# Patient Record
Sex: Female | Born: 1935
Health system: Southern US, Community
[De-identification: ages and names within clinical notes are randomized; demographics above are authoritative.]

## PROBLEM LIST (undated history)

## (undated) DIAGNOSIS — F0390 Unspecified dementia without behavioral disturbance: Secondary | ICD-10-CM

## (undated) DIAGNOSIS — E7800 Pure hypercholesterolemia, unspecified: Secondary | ICD-10-CM

## (undated) HISTORY — PX: OTHER SURGICAL HISTORY: SHX169

---

## 1998-03-19 ENCOUNTER — Ambulatory Visit (HOSPITAL_COMMUNITY): Admission: RE | Admit: 1998-03-19 | Discharge: 1998-03-19 | Payer: Self-pay | Admitting: *Deleted

## 1998-03-19 ENCOUNTER — Encounter: Payer: Self-pay | Admitting: Family Medicine

## 1998-04-16 ENCOUNTER — Encounter: Payer: Self-pay | Admitting: General Surgery

## 1998-04-17 ENCOUNTER — Observation Stay (HOSPITAL_COMMUNITY): Admission: RE | Admit: 1998-04-17 | Discharge: 1998-04-18 | Payer: Self-pay | Admitting: General Surgery

## 1999-01-29 ENCOUNTER — Other Ambulatory Visit: Admission: RE | Admit: 1999-01-29 | Discharge: 1999-01-29 | Payer: Self-pay | Admitting: Obstetrics and Gynecology

## 1999-12-16 ENCOUNTER — Encounter: Payer: Self-pay | Admitting: Obstetrics and Gynecology

## 1999-12-16 ENCOUNTER — Encounter: Admission: RE | Admit: 1999-12-16 | Discharge: 1999-12-16 | Payer: Self-pay | Admitting: Obstetrics and Gynecology

## 2000-02-01 ENCOUNTER — Other Ambulatory Visit: Admission: RE | Admit: 2000-02-01 | Discharge: 2000-02-01 | Payer: Self-pay | Admitting: Obstetrics and Gynecology

## 2000-12-19 ENCOUNTER — Encounter: Payer: Self-pay | Admitting: Obstetrics and Gynecology

## 2000-12-19 ENCOUNTER — Encounter: Admission: RE | Admit: 2000-12-19 | Discharge: 2000-12-19 | Payer: Self-pay | Admitting: Obstetrics and Gynecology

## 2001-02-02 ENCOUNTER — Other Ambulatory Visit: Admission: RE | Admit: 2001-02-02 | Discharge: 2001-02-02 | Payer: Self-pay | Admitting: Internal Medicine

## 2001-12-12 ENCOUNTER — Encounter: Payer: Self-pay | Admitting: Obstetrics and Gynecology

## 2001-12-12 ENCOUNTER — Encounter: Admission: RE | Admit: 2001-12-12 | Discharge: 2001-12-12 | Payer: Self-pay | Admitting: Obstetrics and Gynecology

## 2002-10-22 ENCOUNTER — Encounter: Payer: Self-pay | Admitting: Obstetrics and Gynecology

## 2002-10-22 ENCOUNTER — Ambulatory Visit (HOSPITAL_COMMUNITY): Admission: RE | Admit: 2002-10-22 | Discharge: 2002-10-22 | Payer: Self-pay | Admitting: Obstetrics and Gynecology

## 2002-12-17 ENCOUNTER — Encounter: Payer: Self-pay | Admitting: Obstetrics and Gynecology

## 2002-12-17 ENCOUNTER — Ambulatory Visit (HOSPITAL_COMMUNITY): Admission: RE | Admit: 2002-12-17 | Discharge: 2002-12-17 | Payer: Self-pay | Admitting: Obstetrics and Gynecology

## 2003-02-04 ENCOUNTER — Ambulatory Visit (HOSPITAL_COMMUNITY): Admission: RE | Admit: 2003-02-04 | Discharge: 2003-02-04 | Payer: Self-pay | Admitting: Internal Medicine

## 2003-12-18 ENCOUNTER — Ambulatory Visit (HOSPITAL_COMMUNITY): Admission: RE | Admit: 2003-12-18 | Discharge: 2003-12-18 | Payer: Self-pay | Admitting: Obstetrics and Gynecology

## 2004-07-02 ENCOUNTER — Ambulatory Visit: Payer: Self-pay | Admitting: Internal Medicine

## 2004-07-20 ENCOUNTER — Ambulatory Visit: Payer: Self-pay | Admitting: Internal Medicine

## 2004-12-01 ENCOUNTER — Ambulatory Visit: Payer: Self-pay | Admitting: Internal Medicine

## 2004-12-17 ENCOUNTER — Ambulatory Visit (HOSPITAL_COMMUNITY): Admission: RE | Admit: 2004-12-17 | Discharge: 2004-12-17 | Payer: Self-pay | Admitting: Obstetrics and Gynecology

## 2005-01-19 ENCOUNTER — Ambulatory Visit: Payer: Self-pay | Admitting: Internal Medicine

## 2005-01-25 ENCOUNTER — Ambulatory Visit: Payer: Self-pay | Admitting: Internal Medicine

## 2005-01-25 ENCOUNTER — Ambulatory Visit (HOSPITAL_COMMUNITY): Admission: RE | Admit: 2005-01-25 | Discharge: 2005-01-25 | Payer: Self-pay | Admitting: Internal Medicine

## 2005-05-06 ENCOUNTER — Ambulatory Visit: Payer: Self-pay | Admitting: Internal Medicine

## 2005-08-04 ENCOUNTER — Ambulatory Visit: Payer: Self-pay | Admitting: Internal Medicine

## 2005-09-08 ENCOUNTER — Ambulatory Visit: Payer: Self-pay | Admitting: Internal Medicine

## 2005-12-20 ENCOUNTER — Ambulatory Visit (HOSPITAL_COMMUNITY): Admission: RE | Admit: 2005-12-20 | Discharge: 2005-12-20 | Payer: Self-pay | Admitting: Family Medicine

## 2006-01-21 ENCOUNTER — Ambulatory Visit: Payer: Self-pay | Admitting: Internal Medicine

## 2006-05-27 ENCOUNTER — Ambulatory Visit: Payer: Self-pay | Admitting: Internal Medicine

## 2006-07-08 ENCOUNTER — Ambulatory Visit: Payer: Self-pay | Admitting: Internal Medicine

## 2006-09-09 ENCOUNTER — Ambulatory Visit: Payer: Self-pay | Admitting: Internal Medicine

## 2006-12-23 ENCOUNTER — Ambulatory Visit (HOSPITAL_COMMUNITY): Admission: RE | Admit: 2006-12-23 | Discharge: 2006-12-23 | Payer: Self-pay | Admitting: Obstetrics and Gynecology

## 2007-01-25 ENCOUNTER — Ambulatory Visit: Payer: Self-pay | Admitting: Internal Medicine

## 2007-05-23 ENCOUNTER — Emergency Department (HOSPITAL_COMMUNITY): Admission: EM | Admit: 2007-05-23 | Discharge: 2007-05-23 | Payer: Self-pay | Admitting: *Deleted

## 2007-05-29 ENCOUNTER — Ambulatory Visit: Payer: Self-pay | Admitting: Internal Medicine

## 2007-07-07 ENCOUNTER — Encounter: Payer: Self-pay | Admitting: Internal Medicine

## 2007-07-28 DIAGNOSIS — J45909 Unspecified asthma, uncomplicated: Secondary | ICD-10-CM | POA: Insufficient documentation

## 2007-07-28 DIAGNOSIS — K219 Gastro-esophageal reflux disease without esophagitis: Secondary | ICD-10-CM | POA: Insufficient documentation

## 2007-07-28 DIAGNOSIS — J309 Allergic rhinitis, unspecified: Secondary | ICD-10-CM | POA: Insufficient documentation

## 2007-07-31 ENCOUNTER — Ambulatory Visit: Payer: Self-pay | Admitting: Internal Medicine

## 2007-07-31 DIAGNOSIS — J45909 Unspecified asthma, uncomplicated: Secondary | ICD-10-CM | POA: Insufficient documentation

## 2007-10-09 ENCOUNTER — Ambulatory Visit: Payer: Self-pay | Admitting: Internal Medicine

## 2007-12-26 ENCOUNTER — Ambulatory Visit (HOSPITAL_COMMUNITY): Admission: RE | Admit: 2007-12-26 | Discharge: 2007-12-26 | Payer: Self-pay | Admitting: Family Medicine

## 2008-01-08 ENCOUNTER — Ambulatory Visit: Payer: Self-pay | Admitting: Internal Medicine

## 2008-06-11 ENCOUNTER — Ambulatory Visit: Payer: Self-pay | Admitting: Internal Medicine

## 2008-12-24 ENCOUNTER — Ambulatory Visit: Payer: Self-pay | Admitting: Internal Medicine

## 2008-12-26 ENCOUNTER — Ambulatory Visit (HOSPITAL_COMMUNITY): Admission: RE | Admit: 2008-12-26 | Discharge: 2008-12-26 | Payer: Self-pay | Admitting: Obstetrics and Gynecology

## 2010-04-29 ENCOUNTER — Ambulatory Visit (HOSPITAL_COMMUNITY)
Admission: RE | Admit: 2010-04-29 | Discharge: 2010-04-29 | Payer: Self-pay | Source: Home / Self Care | Admitting: Obstetrics and Gynecology

## 2010-06-28 ENCOUNTER — Encounter: Payer: Self-pay | Admitting: Obstetrics and Gynecology

## 2010-10-23 NOTE — Consult Note (Signed)
NAME:  Angela [name], Angela [name]                          ACCOUNT NO.:  [id number]   MEDICAL RECORD NO.:  [id number]                  PATIENT TYPE:   LOCATION:                                       FACILITY:   PHYSICIAN:  R. Roetta Sessions, M.D.              DATE OF BIRTH:  1932/05/28   DATE OF CONSULTATION:  01/28/2003  DATE OF DISCHARGE:                                   CONSULTATION   REFERRING PHYSICIAN:  Gaynelle Cage, M.D.   CONSULTING PHYSICIAN:  R. Roetta Sessions, M.D.   REASON FOR REFERRAL:  Diarrhea.   HISTORY OF PRESENT ILLNESS:  Angela [name] is a pleasant 77 year old  female kindly sent over at the courtesy of Dr. Gaynelle Cage to further evaluate  an approximate 58-month history of what she describes as diarrhea.  She  describes three to four loose nonbloody bowel movements each morning after  she gets up.  Sometimes before breakfast, sometimes after.  Really has not  had much in the way of any associated abdominal pain.  She has not had any  blood per rectum.  She states after she has her morning bowel movement she  does not have a stool later in the day or at night.  She is not awakened  from sleep to have a bowel movement, and again, she has not had any blood  per rectum.  Accompanying records indicate that she had an O&P assay done on  her stool as well as a C. difficile that came back negative.  She did take  antibiotics last month for a bladder infection.  She has not had any foreign  travel, no one else around her has been sick with diarrhea.  She has never  had a complete colonoscopy, although she describes having what sounds like a  sigmoidoscopy by Dr. Kendrick Ranch down in [place] some 15 years ago.  No  family history of colorectal neoplasia.   In addition to the above mentioned symptoms, she had a several year history  of nearly daily heartburn for which she has taken a variety of over-the-  counter agents.  She has never had her upper GI tract evaluated.  More  recently, she takes Prilosec OTC on a p.r.n. basis.  She does not have  odynophagia, no dysphagia.  She does readily admit to drinking two to three  glasses of wine or glasses of hard liquor on a daily basis to calm her  nerves.  She usually does this in the afternoon.  She tells me that she  needs this to calm her nerves and she does not feel that she can stop this  behavior.  There is no tobacco history.   PAST MEDICAL HISTORY:  1. Recurrent cystitis.  Had an evaluation by Dr. Annabell Howells down in [place]     recently, apparently had a cystoscopy and a pelvic ultrasound.  2. History of gastroesophageal reflux disease.  3.  Hypothyroidism.  4. Asthma.   PAST SURGICAL HISTORY:  Cholecystectomy.   CURRENT MEDICATIONS:  1. Advair 100/50 once daily.  2. Synthroid 100 mcg daily.  3. Fluoxetine 20 mg daily.  4. Trazodone 50 mg 1/2 tablet at bedtime.  5. Caltrate 600 mg daily.  6. Imodium p.r.n.  7. Alavert p.r.n.  8. Prilosec OTC.   ALLERGIES:  Apparently, she has no known drug allergies.   FAMILY HISTORY:  Mother died at age 19 with lung problems.  Father died at  age 6 with CVA.  There is no history of chronic GI or liver illness.   SOCIAL HISTORY:  The patient has been married for 37 years.  She has four  children.  She is retired from [employer]  Does not smoke.  Alcohol  consumption as outlined above.   REVIEW OF SYSTEMS:  No chest pain, no dyspnea, fever, chills, change in  weight.   PHYSICAL EXAMINATION:  GENERAL:  A pleasant 77 year old lady resting  comfortably.  VITAL SIGNS:  Weight 142, height 5 feet 1 inch, temperature 98.5, blood  pressure 118/78, pulse 66.  SKIN:  Warm and dry.  There is no jaundice.  No continued stigmata of  chronic liver disease.  HEENT:  No scleral icterus.  Conjunctivae are pink.  Dentition in fair state  of repair.  Oral cavity without lesions.  NECK:  JVD not prominent.  CHEST:  Lungs are clear to auscultation.  HEART:  Regular rate and  rhythm without murmurs, rubs, or gallops.  BREASTS:  Deferred.  ABDOMEN:  Benign.  Positive bowel sounds, soft, nontender, without  appreciable mass or organomegaly.  RECTAL:  Deferred until time of colonoscopy.   ADMITTING IMPRESSION:  Angela [name] is a pleasant 77 year old lady  with a three month history of loose stools predominately in the morning.  Bowel movements are fairly close together in time, and she really never has  any problems with diarrhea or the need to have a bowel movement later in the  day or evening.   See if toxicology assay and O&P assay came back negative.  She has been on  antibiotics for the past several weeks.   Differential diagnoses includes an occult infectious process (which I tend  to doubt at this point), microscopic colitis, or more likely an element of  irritable bowel syndrome with possibly a component of regular alcohol  consumption as a factor in her perceived diarrhea.   She needs to have her entire lower GI tract evaluated via colonoscopy for  screening purposes anyway.  She has previously put this off, however, she  now is in agreement to proceed.  This lady also has long-standing frequent  (daily) gastroesophageal reflux symptoms, although she does not have any  alarm symptoms, she ought to have her upper GI tract evaluated as a separate  issue.  To this end, I have offered Angela [name] both an EGD and colonoscopy  in the near future at [facility].  The risks, benefits, and  alternatives have been reviewed.  Questions  answered, she is agreeable.  We will make further recommendations once  endoscopic evaluation has been completed.   I would like to thank Dr. Gaynelle Cage once again for allowing me see this nice  lady today.  Jonathon Bellows, M.D.    RMR/MEDQ  D:  01/28/2003  T:  01/28/2003  Job:  [id number]  cc:   Gaynelle Cage, MD  [phone]  [place]  [place] [address]  Fax:  [phone]

## 2010-10-23 NOTE — Assessment & Plan Note (Signed)
[facility]                             PULMONARY OFFICE NOTE   Angela, [name]                     MRN:          [id number]  DATE:07/08/2006                            DOB:          July 09, 1931    PULMONARY/ALLERGY FOLLOWUP:   PROBLEMS:  1. Allergic asthma.  2. Allergic rhinitis.  3. Esophageal reflux.   HISTORY:  A one year followup.  She says she is doing very well, using  Advair just once a day.  Occasional rhinorrhea.  No real asthma  breakthroughs since she began Advair.  She continues allergy vaccine  with no problems, giving her own injections.  I went through our review  of issues related to administration outside of a medical office,  anaphylaxis, and epinephrine.  We updated her EpiPen with discussion.   MEDICATIONS:  1. Advair 100/50.  2. Allergy vaccine.  3. Synthroid 75 mcg.  4. Prozac 40 mg.  5. Simvastatin 40 mg.  6. PRN use of albuterol and epinephrine.   No medication allergy.   OBJECTIVE:  VITAL SIGNS:  Weight 138 pounds.  BP 110/70.  Pulse regular  at 68.  Room air saturation 97%.  GENERAL:  She looks comfortable.  HEENT:  Clear.  CHEST:  Quiet without wheezes, rales or rhonchi.  Work of breathing is  not increased.  CARDIAC:  Heart sounds are regular without murmur heard.  There is no  edema.   IMPRESSION:  Good control of allergic rhinitis and mild asthma.   PLAN:  1. We discussed and gave a trial of Astelin for p.r.n. use.  She will      pay attention this spring to whether it is helpful, 1 spray each      nostril b.i.d. p.r.n.  2. We refilled Advair 100/50 and her rescue albuterol inhaler.  3. EpiPen.  4. Schedule return in one year, earlier p.r.n.     Clinton D. Maple Hudson, MD, Tonny Bollman, FACP  Electronically Signed    CDY/MedQ  DD: 07/09/2006  DT: 07/09/2006  Job #: [id number]   cc:   Ernestina Penna, M.D.

## 2010-10-23 NOTE — Op Note (Signed)
NAMEDONETTA, ISAZA              ACCOUNT NO.:  [id number]   MEDICAL RECORD NO.:  [id number]          PATIENT TYPE:  AMB   LOCATION:  DAY                           FACILITY:  [facility]   PHYSICIAN:  R. Roetta Sessions, M.D. DATE OF BIRTH:  [birth year]   DATE OF PROCEDURE:  01/25/2005  DATE OF DISCHARGE:                                 OPERATIVE REPORT   PROCEDURE:  Diagnostic esophagogastroduodenoscopy.   INDICATION FOR PROCEDURE:  The patient is a 77 year old lady with a recent  worsening of reflux symptoms, taking Actonel and Fosamax.  Prilosec was  changed to Prevacid.  She stopped taking the above-mentioned agents and her  reflux symptoms have pretty much resolved.  She has not had any odynophagia  or dysphagia.  EGD is now being done.   PROCEDURE NOTE:  O2 saturation, blood pressure, pulse and respirations were  monitored throughout the entire procedure.  This procedure was discussed  with the patient, along with the potential risks, benefits, and alternatives  prior to the procedure.   CONSCIOUS SEDATION:  IV Versed and Demerol in incremental doses.   INSTRUMENT USED:  Olympus video chip system.   FINDINGS:  Examination of the tubular esophagus revealed no mucosal  abnormalities.  The EG junction was patulous, easily traversed.   Stomach:  The gastric cavity was empty and insufflated well with air.  A  thorough examination of the gastric mucosa including retroflexed view of the  proximal stomach and esophagogastric junction demonstrated only a hiatal  hernia.  Pylorus patent and easily traversed.  Examination of the bulb and  second portion revealed no abnormalities.   Therapeutic/diagnostic maneuvers performed:  None.   The patient tolerated the procedure well, was reacted in endoscopy.   IMPRESSION:  Normal esophageal mucosa, patulous esophagogastric junction,  hiatal hernia, otherwise normal stomach, normal D1, D2.   RECOMMENDATIONS:  1.  Continue Prevacid 30 mg orally  daily.  2.  Antireflux literature provided to Ms. Trombetta.  3.  Follow up with Ernestina Penna, M.D., as planned.      Jonathon Bellows, M.D.  Electronically Signed     RMR/MEDQ  D:  01/25/2005  T:  01/25/2005  Job:  [id number]   cc:   Ernestina Penna, M.D.  [address] [place]  [place] [address]  Fax: [phone]

## 2010-10-23 NOTE — Op Note (Signed)
NAME:  Angela [name], Angela [name]                        ACCOUNT NO.:  [id number]   MEDICAL RECORD NO.:  [id number]                   PATIENT TYPE:  AMB   LOCATION:  DAY                                  FACILITY:  [facility]   PHYSICIAN:  R. Roetta Sessions, M.D.              DATE OF BIRTH:  1932-04-27   DATE OF PROCEDURE:  DATE OF DISCHARGE:                                 OPERATIVE REPORT   PROCEDURE:  Esophagogastroduodenoscopy and colonoscopy with snare  polypectomy with biopsy.   ENDOSCOPIST:  Gerrit Friends. Rourk, M.D.   INDICATIONS FOR PROCEDURE:  The patient is a 77 year old lady with  longstanding gastroesophageal reflux symptoms and several month history of  diarrhea.  She has never had her lower GI tract imaged.  EGD and colonoscopy  are now being done for the above-mentioned reasons.  This approach has been  discussed with the patient at length, previously. The potential risks,  benefits, and alternatives have been reviewed; questions answered.  She is  agreeable.  Please see my dictated H&P for more information.   PROCEDURE NOTE:  O2 saturation, blood pressure, pulse and respirations were  monitored throughout the entire procedure.  Conscious sedation: Versed 4 mg  IV, Demerol 100 mg IV in divided doses.   INSTRUMENT:  Olympus video chip adult gastroscope and colonoscope.   FINDINGS:  Examination of the tubular esophagus revealed a patulous EG  junction with distal esophageal erosions.  There is no Barrett's esophagus  or other abnormality.  The EG junction was easily traversed.   STOMACH:  The gastric cavity was empty.  It insufflated well with air.  A  thorough examination of the gastric mucosa including a retroflex view of the  proximal stomach and esophagogastric junction demonstrated only a moderate  small-sized hiatal hernia.  The pylorus was patent and easily traversed.   DUODENUM:  The bulb and the second portion appeared normal.   THERAPEUTIC/DIAGNOSTIC MANEUVERS:  None.   The patient tolerated the procedure well and was prepared for colonoscopy.  A digital rectal exam revealed no abnormalities.   ENDOSCOPIC FINDINGS:  The prep was adequate.   RECTUM:  Examination of the rectal mucosa including the retroflex view of  the anal verge revealed a 5-mm polyp on a stalk in the distal rectum.  The  remainder of the rectal mucosa appeared normal.   COLON:  The colonic mucosa was surveyed from the rectosigmoid junction  through the left transverse and right colon to the area of the appendiceal  orifice, ileocecal valve, and cecum.  These structures were well seen and  photographed for the record.  The patient was noted to have a 1-cm,  pedunculated polyp in the mid-descending colon. There were 2 diminutive  polyps in the right colon.   From this level the scope was slowly withdrawn.  All previously mentioned  mucosal surfaces were again seen.  The diminutive polyps in the right colon  were destroyed with the tip of  the snare cautery unit.  The polyp in the  descending colon was removed with snare cautery and recovered.  The polyp in  the rectum was removed and recovered with snare cautery.  Biopsy of the  sigmoid and rectal mucosa were taken and real microscopic colitis stool  sample was also obtained.  The patient tolerated the procedures well and was  reacted in endoscopy.   EGD IMPRESSION:  1. Patulous esophagogastric junction, distal esophageal erosions consistent     with erosive reflux esophagitis.  2. Moderate size hiatal hernia.  The remainder of her upper gastrointestinal     tract appeared normal.   COLONOSCOPY IMPRESSION:  1. Polyps in the rectum and colon as described above; removed and/or     destroyed as described above.  2. Biopsies of the sigmoid and rectal mucosa taken for histologic study.  3. Stool sample obtained.   RECOMMENDATIONS:  1. Begin Aciphex 20 mg orally daily, 30 minutes before breakfast.  She is to     go to my office for  free samples.  2. Antireflux literature provided to the patient.  3. No aspirin or arthritis medications for 10 days.  4. Follow up on path, stool studies.  5. NuLev 1 tablet on the tongue before meals and at bedtime as needed for     diarrhea.  6. Follow up appointment with [place] in 1 month.                                               Jonathon Bellows, M.D.    RMR/MEDQ  D:  02/04/2003  T:  02/04/2003  Job:  [id number]   cc:   Gaynelle Cage, MD  [phone]  [place]  [place] [address]  Fax: [phone]

## 2010-10-23 NOTE — H&P (Signed)
Angela [name], Angela [name]              ACCOUNT NO.:  [id number]   MEDICAL RECORD NO.:  [id number]          PATIENT TYPE:  AMB   LOCATION:  DAY                           FACILITY:  [facility]   PHYSICIAN:  R. Roetta Sessions, M.D. DATE OF BIRTH:  [birth year]   DATE OF ADMISSION:  DATE OF DISCHARGE:  [facility]                                HISTORY & PHYSICAL   CHIEF COMPLAINT:  Acid reflux, anemia.   HISTORY OF PRESENT ILLNESS:  Angela [name] is a 77 year old Caucasian female  who presents today for the above-stated symptoms.  She was last seen in  September 2004.  She has a history of chronic GERD with evidence of erosive  reflux esophagitis on EGD in August 2004.  She also had a colonoscopy in  August 2004 which revealed polyps in the rectum.  Biopsies consistent with  mild chronic active colitis with mild cryptitis.  Descending colon polyp was  inflamed, adenomatous polyp.  She was doing fairly well; however, a couple  of months ago she started having problems with acid reflux and epigastric  pain.  She was on Prilosec OTC.  She had been on Actonel, and more recently  Fosamax, but all of these agents have been discontinued.  She was switched  to Prevacid a couple weeks ago, and has noted some improvement of her heart  murmur symptoms.  Epigastric pain is intermittent in nature.  It is not  necessarily worsened with meals.  Denies any dysphagia or odynophagia,  constipation, diarrhea, melena or rectal bleeding.  She was told she was  anemic.  She reports having negative Hemoccults.   CURRENT MEDICATIONS:  1.  Advair 250/50 daily.  2.  Synthroid 112 mcg daily.  3.  Fluoxetine 20 mg daily.  4.  Caltrate 600 plus D daily.  5.  Pred Forte one drop q.i.d.  6.  Abbreva p.r.n.  7.  Tricor 48 mg daily.  8.  Zetia 10 mg daily.  9.  Prevacid solutab daily.   ALLERGIES:  No known drug allergies.   PAST MEDICAL HISTORY:  1.  Gastroesophageal reflux disease.  2.  Hypothyroidism.  3.  Asthma.  4.   Depression.  5.  Hypercholesterolemia.  6.  History of recurrent cystitis.  7.  Status post cholecystectomy.  8.  Thyroid goiter resection.  9.  Left cataract extraction.   FAMILY HISTORY:  Father had a stroke.  No family history of colorectal  cancer.   SOCIAL HISTORY:  She is married and has 4 children.  She is retired from  [employer]  Does not smoke.  She consumes about 2 glasses of wine each night,  to calm her nerves.Marland Kitchen   REVIEW OF SYSTEMS:  See HPI for GI.  CONSTITUTIONAL:  Denies any weight  loss.  CARDIOPULMONARY:  Denies chest pain or shortness of breath.   PHYSICAL EXAMINATION:  VITAL SIGNS:  Weight 154 (up from 141 in September  2004).  Blood pressure 132/80, pulse 80, temperature 98.1.  GENERAL:  Pleasant, mildly obese Caucasian female in no acute distress.  SKIN:  Warm and dry.  No jaundice.  HEENT:  Conjunctivae are pink.  Sclerae are non-icteric.  Oropharyngeal  mucosa moist and pink.  No lesions, erythema, or exudate.  No  lymphadenopathy or thyromegaly.  CHEST:  Lungs are clear to auscultation.  CARDIAC:  Regular rate and rhythm.  No murmurs, rubs, or gallops.  ABDOMEN:  Positive bowel sounds.  Slightly obese, but symmetrical.  Soft.  Nontender.  No organomegaly or masses.  No rebound tenderness or guarding.  No abdominal bruits or hernias.  EXTREMITIES:  No edema.   IMPRESSION:  Angela [name] is a 77 year old Caucasian female who recently had  a flare up of her acid reflux disease and epigastric pain in the setting of  Actonel, and more recently Fosamax use.  Her symptoms are improving now on  Prevacid (switched from Prilosec to OTC).  She apparently also has some  anemia issues, although I do not have any records regarding that.  She  reports having negative Hemoccult cards.  Given recent significant flare up  of acid reflux and epigastric pain, it is reasonable to proceed with upper  endoscopy for further evaluation.  She may have erosive or ulcerative   esophagitis from acid reflux and/or medication.   PLAN:  1.  EGD in the near future.  2.  Will obtain records from Dr. Roe Coombs Moore's office regarding her anemia and      Hemoccults.  Recent colonoscopy in August 2004 is reassuring.      Tana Coast, P.AJonathon Bellows, M.D.  Electronically Signed   LL/MEDQ  D:  01/19/2005  T:  01/19/2005  Job:  [id number]   cc:   Ernestina Penna, M.D.  [address] [place]  [place] [address]  Fax: [phone]

## 2011-03-12 LAB — CBC
HCT: 41
MCHC: 34
Platelets: 236
RDW: 14.4
WBC: 6.7

## 2011-03-12 LAB — BASIC METABOLIC PANEL
Calcium: 9.3
Chloride: 105
Creatinine, Ser: 0.79
GFR calc Af Amer: >60
Sodium: 139

## 2011-03-12 LAB — URINALYSIS, ROUTINE W REFLEX MICROSCOPIC
Bilirubin Urine: NEGATIVE
Hgb urine dipstick: NEGATIVE
Nitrite: NEGATIVE
Specific Gravity, Urine: >1.03 — ABNORMAL HIGH
pH: 5

## 2011-03-12 LAB — DIFFERENTIAL
Basophils Absolute: 0.1
Eosinophils Absolute: 0.1 — ABNORMAL LOW
Eosinophils Relative: 2
Monocytes Relative: 9
Neutrophils Relative %: 55

## 2011-03-12 LAB — RAPID URINE DRUG SCREEN, HOSP PERFORMED
Amphetamines: NOT DETECTED
Barbiturates: NOT DETECTED
Benzodiazepines: NOT DETECTED
Cocaine: NOT DETECTED
Opiates: NOT DETECTED

## 2018-11-27 DIAGNOSIS — E7800 Pure hypercholesterolemia, unspecified: Secondary | ICD-10-CM | POA: Diagnosis not present

## 2018-11-27 DIAGNOSIS — Z789 Other specified health status: Secondary | ICD-10-CM | POA: Diagnosis not present

## 2018-11-27 DIAGNOSIS — Z299 Encounter for prophylactic measures, unspecified: Secondary | ICD-10-CM | POA: Diagnosis not present

## 2018-11-27 DIAGNOSIS — Z6831 Body mass index (BMI) 31.0-31.9, adult: Secondary | ICD-10-CM | POA: Diagnosis not present

## 2018-11-27 DIAGNOSIS — R12 Heartburn: Secondary | ICD-10-CM | POA: Diagnosis not present

## 2018-11-27 DIAGNOSIS — R062 Wheezing: Secondary | ICD-10-CM | POA: Diagnosis not present

## 2018-11-27 DIAGNOSIS — R42 Dizziness and giddiness: Secondary | ICD-10-CM | POA: Diagnosis not present

## 2018-12-26 DIAGNOSIS — Z299 Encounter for prophylactic measures, unspecified: Secondary | ICD-10-CM | POA: Diagnosis not present

## 2018-12-26 DIAGNOSIS — Z1339 Encounter for screening examination for other mental health and behavioral disorders: Secondary | ICD-10-CM | POA: Diagnosis not present

## 2018-12-26 DIAGNOSIS — Z0000 Encounter for general adult medical examination without abnormal findings: Secondary | ICD-10-CM | POA: Diagnosis not present

## 2018-12-26 DIAGNOSIS — Z7189 Other specified counseling: Secondary | ICD-10-CM | POA: Diagnosis not present

## 2018-12-26 DIAGNOSIS — Z6834 Body mass index (BMI) 34.0-34.9, adult: Secondary | ICD-10-CM | POA: Diagnosis not present

## 2018-12-26 DIAGNOSIS — Z1331 Encounter for screening for depression: Secondary | ICD-10-CM | POA: Diagnosis not present

## 2018-12-26 DIAGNOSIS — Z1211 Encounter for screening for malignant neoplasm of colon: Secondary | ICD-10-CM | POA: Diagnosis not present

## 2019-01-01 DIAGNOSIS — E039 Hypothyroidism, unspecified: Secondary | ICD-10-CM | POA: Diagnosis not present

## 2019-01-01 DIAGNOSIS — Z79899 Other long term (current) drug therapy: Secondary | ICD-10-CM | POA: Diagnosis not present

## 2019-01-01 DIAGNOSIS — E7800 Pure hypercholesterolemia, unspecified: Secondary | ICD-10-CM | POA: Diagnosis not present

## 2019-01-05 DIAGNOSIS — B351 Tinea unguium: Secondary | ICD-10-CM | POA: Diagnosis not present

## 2019-01-17 DIAGNOSIS — E876 Hypokalemia: Secondary | ICD-10-CM | POA: Diagnosis not present

## 2019-03-30 DIAGNOSIS — E0590 Thyrotoxicosis, unspecified without thyrotoxic crisis or storm: Secondary | ICD-10-CM | POA: Diagnosis not present

## 2019-03-30 DIAGNOSIS — G309 Alzheimer's disease, unspecified: Secondary | ICD-10-CM | POA: Diagnosis not present

## 2019-03-30 DIAGNOSIS — F419 Anxiety disorder, unspecified: Secondary | ICD-10-CM | POA: Diagnosis not present

## 2019-03-30 DIAGNOSIS — Z6834 Body mass index (BMI) 34.0-34.9, adult: Secondary | ICD-10-CM | POA: Diagnosis not present

## 2019-03-30 DIAGNOSIS — Z299 Encounter for prophylactic measures, unspecified: Secondary | ICD-10-CM | POA: Diagnosis not present

## 2019-03-30 DIAGNOSIS — F0280 Dementia in other diseases classified elsewhere without behavioral disturbance: Secondary | ICD-10-CM | POA: Diagnosis not present

## 2019-05-21 DIAGNOSIS — Z20828 Contact with and (suspected) exposure to other viral communicable diseases: Secondary | ICD-10-CM | POA: Diagnosis not present

## 2019-05-21 DIAGNOSIS — U071 COVID-19: Secondary | ICD-10-CM | POA: Diagnosis not present

## 2019-10-12 DIAGNOSIS — Z20828 Contact with and (suspected) exposure to other viral communicable diseases: Secondary | ICD-10-CM | POA: Diagnosis not present

## 2019-10-12 DIAGNOSIS — U071 COVID-19: Secondary | ICD-10-CM | POA: Diagnosis not present

## 2019-12-04 DIAGNOSIS — Z20828 Contact with and (suspected) exposure to other viral communicable diseases: Secondary | ICD-10-CM | POA: Diagnosis not present

## 2019-12-04 DIAGNOSIS — U071 COVID-19: Secondary | ICD-10-CM | POA: Diagnosis not present

## 2019-12-14 DIAGNOSIS — B351 Tinea unguium: Secondary | ICD-10-CM | POA: Diagnosis not present

## 2020-01-17 DIAGNOSIS — F0280 Dementia in other diseases classified elsewhere without behavioral disturbance: Secondary | ICD-10-CM | POA: Diagnosis not present

## 2020-01-17 DIAGNOSIS — G309 Alzheimer's disease, unspecified: Secondary | ICD-10-CM | POA: Diagnosis not present

## 2020-01-17 DIAGNOSIS — F331 Major depressive disorder, recurrent, moderate: Secondary | ICD-10-CM | POA: Diagnosis not present

## 2020-01-17 DIAGNOSIS — R5383 Other fatigue: Secondary | ICD-10-CM | POA: Diagnosis not present

## 2020-01-17 DIAGNOSIS — Z299 Encounter for prophylactic measures, unspecified: Secondary | ICD-10-CM | POA: Diagnosis not present

## 2020-01-17 DIAGNOSIS — E039 Hypothyroidism, unspecified: Secondary | ICD-10-CM | POA: Diagnosis not present

## 2020-01-17 DIAGNOSIS — Z1339 Encounter for screening examination for other mental health and behavioral disorders: Secondary | ICD-10-CM | POA: Diagnosis not present

## 2020-01-17 DIAGNOSIS — Z79899 Other long term (current) drug therapy: Secondary | ICD-10-CM | POA: Diagnosis not present

## 2020-01-17 DIAGNOSIS — Z1331 Encounter for screening for depression: Secondary | ICD-10-CM | POA: Diagnosis not present

## 2020-01-17 DIAGNOSIS — Z0000 Encounter for general adult medical examination without abnormal findings: Secondary | ICD-10-CM | POA: Diagnosis not present

## 2020-01-17 DIAGNOSIS — Z7189 Other specified counseling: Secondary | ICD-10-CM | POA: Diagnosis not present

## 2020-01-17 DIAGNOSIS — Z6833 Body mass index (BMI) 33.0-33.9, adult: Secondary | ICD-10-CM | POA: Diagnosis not present

## 2020-01-17 DIAGNOSIS — E7800 Pure hypercholesterolemia, unspecified: Secondary | ICD-10-CM | POA: Diagnosis not present

## 2020-03-28 DIAGNOSIS — M79675 Pain in left toe(s): Secondary | ICD-10-CM | POA: Diagnosis not present

## 2020-03-28 DIAGNOSIS — I739 Peripheral vascular disease, unspecified: Secondary | ICD-10-CM | POA: Diagnosis not present

## 2020-03-28 DIAGNOSIS — B351 Tinea unguium: Secondary | ICD-10-CM | POA: Diagnosis not present

## 2020-07-16 DIAGNOSIS — U071 COVID-19: Secondary | ICD-10-CM | POA: Diagnosis not present

## 2020-07-16 DIAGNOSIS — Z20828 Contact with and (suspected) exposure to other viral communicable diseases: Secondary | ICD-10-CM | POA: Diagnosis not present

## 2020-07-21 DIAGNOSIS — Z6833 Body mass index (BMI) 33.0-33.9, adult: Secondary | ICD-10-CM | POA: Diagnosis not present

## 2020-07-21 DIAGNOSIS — F0280 Dementia in other diseases classified elsewhere without behavioral disturbance: Secondary | ICD-10-CM | POA: Diagnosis not present

## 2020-07-21 DIAGNOSIS — Z789 Other specified health status: Secondary | ICD-10-CM | POA: Diagnosis not present

## 2020-07-21 DIAGNOSIS — Z299 Encounter for prophylactic measures, unspecified: Secondary | ICD-10-CM | POA: Diagnosis not present

## 2020-07-21 DIAGNOSIS — E7800 Pure hypercholesterolemia, unspecified: Secondary | ICD-10-CM | POA: Diagnosis not present

## 2020-07-21 DIAGNOSIS — G309 Alzheimer's disease, unspecified: Secondary | ICD-10-CM | POA: Diagnosis not present

## 2020-07-21 DIAGNOSIS — E0590 Thyrotoxicosis, unspecified without thyrotoxic crisis or storm: Secondary | ICD-10-CM | POA: Diagnosis not present

## 2020-07-22 DIAGNOSIS — Z20828 Contact with and (suspected) exposure to other viral communicable diseases: Secondary | ICD-10-CM | POA: Diagnosis not present

## 2020-07-22 DIAGNOSIS — U071 COVID-19: Secondary | ICD-10-CM | POA: Diagnosis not present

## 2020-08-15 DIAGNOSIS — B351 Tinea unguium: Secondary | ICD-10-CM | POA: Diagnosis not present

## 2020-08-15 DIAGNOSIS — Z20828 Contact with and (suspected) exposure to other viral communicable diseases: Secondary | ICD-10-CM | POA: Diagnosis not present

## 2020-08-15 DIAGNOSIS — U071 COVID-19: Secondary | ICD-10-CM | POA: Diagnosis not present

## 2020-10-17 DIAGNOSIS — F331 Major depressive disorder, recurrent, moderate: Secondary | ICD-10-CM | POA: Diagnosis not present

## 2020-10-17 DIAGNOSIS — Z299 Encounter for prophylactic measures, unspecified: Secondary | ICD-10-CM | POA: Diagnosis not present

## 2020-10-17 DIAGNOSIS — G309 Alzheimer's disease, unspecified: Secondary | ICD-10-CM | POA: Diagnosis not present

## 2020-10-17 DIAGNOSIS — F0280 Dementia in other diseases classified elsewhere without behavioral disturbance: Secondary | ICD-10-CM | POA: Diagnosis not present

## 2020-10-17 DIAGNOSIS — E0590 Thyrotoxicosis, unspecified without thyrotoxic crisis or storm: Secondary | ICD-10-CM | POA: Diagnosis not present

## 2021-01-30 DIAGNOSIS — E039 Hypothyroidism, unspecified: Secondary | ICD-10-CM | POA: Diagnosis not present

## 2021-01-30 DIAGNOSIS — R5383 Other fatigue: Secondary | ICD-10-CM | POA: Diagnosis not present

## 2021-01-30 DIAGNOSIS — Z1331 Encounter for screening for depression: Secondary | ICD-10-CM | POA: Diagnosis not present

## 2021-01-30 DIAGNOSIS — Z299 Encounter for prophylactic measures, unspecified: Secondary | ICD-10-CM | POA: Diagnosis not present

## 2021-01-30 DIAGNOSIS — E7800 Pure hypercholesterolemia, unspecified: Secondary | ICD-10-CM | POA: Diagnosis not present

## 2021-01-30 DIAGNOSIS — Z6832 Body mass index (BMI) 32.0-32.9, adult: Secondary | ICD-10-CM | POA: Diagnosis not present

## 2021-01-30 DIAGNOSIS — Z0000 Encounter for general adult medical examination without abnormal findings: Secondary | ICD-10-CM | POA: Diagnosis not present

## 2021-01-30 DIAGNOSIS — Z1339 Encounter for screening examination for other mental health and behavioral disorders: Secondary | ICD-10-CM | POA: Diagnosis not present

## 2021-01-30 DIAGNOSIS — Z79899 Other long term (current) drug therapy: Secondary | ICD-10-CM | POA: Diagnosis not present

## 2021-01-30 DIAGNOSIS — Z7189 Other specified counseling: Secondary | ICD-10-CM | POA: Diagnosis not present

## 2021-05-01 DIAGNOSIS — R051 Acute cough: Secondary | ICD-10-CM | POA: Diagnosis not present

## 2021-05-01 DIAGNOSIS — R52 Pain, unspecified: Secondary | ICD-10-CM | POA: Diagnosis not present

## 2021-05-01 DIAGNOSIS — J329 Chronic sinusitis, unspecified: Secondary | ICD-10-CM | POA: Diagnosis not present

## 2021-08-04 DIAGNOSIS — Z789 Other specified health status: Secondary | ICD-10-CM | POA: Diagnosis not present

## 2021-08-04 DIAGNOSIS — E039 Hypothyroidism, unspecified: Secondary | ICD-10-CM | POA: Diagnosis not present

## 2021-08-04 DIAGNOSIS — F0280 Dementia in other diseases classified elsewhere without behavioral disturbance: Secondary | ICD-10-CM | POA: Diagnosis not present

## 2021-08-04 DIAGNOSIS — G309 Alzheimer's disease, unspecified: Secondary | ICD-10-CM | POA: Diagnosis not present

## 2021-08-04 DIAGNOSIS — Z6832 Body mass index (BMI) 32.0-32.9, adult: Secondary | ICD-10-CM | POA: Diagnosis not present

## 2021-08-04 DIAGNOSIS — Z299 Encounter for prophylactic measures, unspecified: Secondary | ICD-10-CM | POA: Diagnosis not present

## 2021-08-04 DIAGNOSIS — F331 Major depressive disorder, recurrent, moderate: Secondary | ICD-10-CM | POA: Diagnosis not present

## 2021-09-07 DIAGNOSIS — H6120 Impacted cerumen, unspecified ear: Secondary | ICD-10-CM | POA: Diagnosis not present

## 2021-10-16 ENCOUNTER — Emergency Department (HOSPITAL_COMMUNITY)
Admission: EM | Admit: 2021-10-16 | Discharge: 2021-10-17 | Disposition: A | Discharge status: Home / Self Care | Payer: Medicare HMO | Attending: Emergency Medicine | Admitting: Emergency Medicine

## 2021-10-16 ENCOUNTER — Emergency Department (HOSPITAL_COMMUNITY): Payer: Medicare HMO

## 2021-10-16 ENCOUNTER — Other Ambulatory Visit: Payer: Self-pay

## 2021-10-16 ENCOUNTER — Encounter (HOSPITAL_COMMUNITY): Payer: Self-pay | Admitting: Emergency Medicine

## 2021-10-16 DIAGNOSIS — R4182 Altered mental status, unspecified: Secondary | ICD-10-CM | POA: Diagnosis not present

## 2021-10-16 DIAGNOSIS — R39198 Other difficulties with micturition: Secondary | ICD-10-CM | POA: Diagnosis not present

## 2021-10-16 DIAGNOSIS — F0390 Unspecified dementia without behavioral disturbance: Secondary | ICD-10-CM | POA: Diagnosis not present

## 2021-10-16 DIAGNOSIS — R410 Disorientation, unspecified: Secondary | ICD-10-CM | POA: Diagnosis not present

## 2021-10-16 HISTORY — DX: Pure hypercholesterolemia, unspecified: E78.00

## 2021-10-16 HISTORY — DX: Unspecified dementia, unspecified severity, without behavioral disturbance, psychotic disturbance, mood disturbance, and anxiety: F03.90

## 2021-10-16 LAB — URINALYSIS, ROUTINE W REFLEX MICROSCOPIC
Bilirubin Urine: NEGATIVE
Glucose, UA: NEGATIVE mg/dL
Hgb urine dipstick: NEGATIVE
Ketones, ur: NEGATIVE mg/dL
Leukocytes,Ua: NEGATIVE
Nitrite: NEGATIVE
Protein, ur: NEGATIVE mg/dL
Specific Gravity, Urine: 1.012 (ref 1.005–1.030)
pH: 5 (ref 5.0–8.0)

## 2021-10-16 LAB — CBC
HCT: 37.3 % (ref 36.0–46.0)
Hemoglobin: 11.9 g/dL — ABNORMAL LOW (ref 12.0–15.0)
MCH: 30.4 pg (ref 26.0–34.0)
MCHC: 31.9 g/dL (ref 30.0–36.0)
MCV: 95.2 fL (ref 80.0–100.0)
Platelets: 150 10*3/uL (ref 150–400)
RBC: 3.92 MIL/uL (ref 3.87–5.11)
RDW: 14.6 % (ref 11.5–15.5)
WBC: 3.5 10*3/uL — ABNORMAL LOW (ref 4.0–10.5)
nRBC: 0 % (ref 0.0–0.2)

## 2021-10-16 LAB — COMPREHENSIVE METABOLIC PANEL
ALT: 18 U/L (ref 0–44)
AST: 19 U/L (ref 15–41)
Albumin: 4 g/dL (ref 3.5–5.0)
Alkaline Phosphatase: 67 U/L (ref 38–126)
Anion gap: 6 (ref 5–15)
BUN: 23 mg/dL (ref 8–23)
CO2: 27 mmol/L (ref 22–32)
Calcium: 8.6 mg/dL — ABNORMAL LOW (ref 8.9–10.3)
Chloride: 107 mmol/L (ref 98–111)
Creatinine, Ser: 0.98 mg/dL (ref 0.44–1.00)
GFR, Estimated: 56 mL/min — ABNORMAL LOW (ref >60)
Glucose, Bld: 91 mg/dL (ref 70–99)
Potassium: 3.7 mmol/L (ref 3.5–5.1)
Sodium: 140 mmol/L (ref 135–145)
Total Bilirubin: 0.5 mg/dL (ref 0.3–1.2)
Total Protein: 7.1 g/dL (ref 6.5–8.1)

## 2021-10-16 MED ORDER — LACTATED RINGERS IV BOLUS
1000.0000 mL | Freq: Once | INTRAVENOUS | Status: AC
  Administered 2021-10-17: 1000 mL via INTRAVENOUS

## 2021-10-16 NOTE — ED Notes (Signed)
Ambulatory with stand by assist. Pt forgetful but easily reoriented. Son at bedside  ?

## 2021-10-16 NOTE — ED Provider Notes (Signed)
?[facility] ?Provider Note ? ? ?CSN: [id number] ?Arrival date & time: 10/16/21  1534 ? ?  ? ?History ? ?Chief Complaint  ?Patient presents with  ? Altered Mental Status  ? ? ?Angela [name] is a 88 y.o. female. ? ?88 yo F w/ h/o dementia here with son for hallucinations. Her sister, whom she was very close with, passed away four days ago. 2-3 days ago patient started talking to other dead family members in normal conversational tones and subjects. Facility thought it might be a UTI, son took her to UC where UA showed elevated specific gravity and trace leukocyte but no other abnormalities. No recent falls, infections, change in eating or other symptoms. Dx w/ dementia 15 years ago when her husband died and had a sudden decline in mental status. Has been pretty stable since then without much change in memory or ADL's.  ? ? ?Altered Mental Status ? ?  ? ?Home Medications ?Prior to Admission medications   ?Not on File  ?   ? ?Allergies    ?Patient has no known allergies.   ? ?Review of Systems   ?Review of Systems ? ?Physical Exam ?Updated Vital Signs ?BP 125/68   Pulse (!) 58   Temp 98.3 ?F (36.8 ?C) (Oral)   Resp 14   Ht 5\' 4"  (1.626 m)   Wt 83 kg   SpO2 97%   BMI 31.41 kg/m?  ?Physical Exam ?Vitals and nursing note reviewed.  ?Constitutional:   ?   Appearance: She is well-developed.  ?HENT:  ?   Head: Normocephalic and atraumatic.  ?   Mouth/Throat:  ?   Mouth: Mucous membranes are moist.  ?Eyes:  ?   Pupils: Pupils are equal, round, and reactive to light.  ?Cardiovascular:  ?   Rate and Rhythm: Normal rate and regular rhythm.  ?   Heart sounds: No murmur heard. ?Pulmonary:  ?   Effort: No respiratory distress.  ?   Breath sounds: No stridor. No wheezing or rhonchi.  ?Abdominal:  ?   General: Abdomen is flat. There is no distension.  ?   Tenderness: There is no abdominal tenderness.  ?Musculoskeletal:  ?   Cervical back: Normal range of motion.  ?Skin: ?   General: Skin is warm and dry.   ?Neurological:  ?   General: No focal deficit present.  ?   Mental Status: She is alert.  ? ? ?ED Results / Procedures / Treatments   ?Labs ?(all labs ordered are listed, but only abnormal results are displayed) ?Labs Reviewed  ?COMPREHENSIVE METABOLIC PANEL - Abnormal; Notable for the following components:  ?    Result Value  ? Calcium 8.6 (*)   ? GFR, Estimated 56 (*)   ? All other components within normal limits  ?CBC - Abnormal; Notable for the following components:  ? WBC 3.5 (*)   ? Hemoglobin 11.9 (*)   ? All other components within normal limits  ?URINE CULTURE  ?URINALYSIS, ROUTINE W REFLEX MICROSCOPIC  ?AMMONIA  ? ? ?EKG ?None ? ?Radiology ?DG Chest 2 View ? ?Result Date: 10/17/2021 ?CLINICAL DATA:  Confusion EXAM: CHEST - 2 VIEW COMPARISON:  None Available. FINDINGS: Mild cardiomegaly. Moderate-sized hiatal hernia. Prominently linear density at the right lung base, likely scarring or atelectasis. Left lung clear. No effusions. No acute bony abnormality. IMPRESSION: Cardiomegaly. Right base atelectasis or scarring. Moderate-sized hiatal hernia. Electronically Signed   By: Rolm Baptise M.D.   On: 10/17/2021 00:18  ? ?  CT Head Wo Contrast ? ?Result Date: 10/16/2021 ?CLINICAL DATA:  Altered mental status over the past few days EXAM: CT HEAD WITHOUT CONTRAST TECHNIQUE: Contiguous axial images were obtained from the base of the skull through the vertex without intravenous contrast. RADIATION DOSE REDUCTION: This exam was performed according to the departmental dose-optimization program which includes automated exposure control, adjustment of the mA and/or kV according to patient size and/or use of iterative reconstruction technique. COMPARISON:  None Available. FINDINGS: Brain: No evidence of acute infarction, hemorrhage, hydrocephalus, extra-axial collection or mass lesion/mass effect. Chronic ischemic changes are noted. Vascular: No hyperdense vessel or unexpected calcification. Skull: Normal. Negative for  fracture or focal lesion. Sinuses/Orbits: No acute finding. Other: None. IMPRESSION: Chronic white matter ischemic changes.  No acute abnormality noted. Electronically Signed   By: Inez Catalina M.D.   On: 10/16/2021 22:28   ? ?Procedures ?Procedures  ? ? ?Medications Ordered in ED ?Medications  ?lactated ringers bolus 1,000 mL (0 mLs Intravenous Stopped 10/17/21 0206)  ? ? ?ED Course/ Medical Decision Making/ A&P ?  ?                        ?Medical Decision Making ?Amount and/or Complexity of Data Reviewed ?Labs: ordered. ?Radiology: ordered. ?ECG/medicine tests: ordered. ? ?Wondering if this is a stress reaction/coping mechanism. Will evaluate for medical cuases but doesn't really fit. High spec grav at [facility] will give a liter of fluids as well although creatiinene and UA here are reassuring. ? ? ?Final Clinical Impression(s) / ED Diagnoses ?Final diagnoses:  ?Altered mental status, unspecified altered mental status type  ? ? ?Rx / DC Orders ?ED Discharge Orders   ? ? None  ? ?  ? ? ?  ?Merrily Pew, MD ?10/17/21 [phone] ? ?

## 2021-10-16 NOTE — ED Triage Notes (Addendum)
Pt brought in by her son. Son reports confusion and pt talking to people that aren't there over the past couple of days. States this is not normal for her, but she does have dementia. Pt went to UC this morning and was told she had the beginning signs of UTI, but was sent over here for further evaluation. Pt does c/o pain with urination. Pt from [facility] in [facility]. ?

## 2021-10-16 NOTE — ED Provider Notes (Incomplete)
?  [facility] EMERGENCY DEPARTMENT ?Provider Note ? ? ?CSN: [id number] ?Arrival date & time: 10/16/21  1534 ? ?  ? ?History ?{Add pertinent medical, surgical, social history, OB history to HPI:1} ?Chief Complaint  ?Patient presents with  ?? Altered Mental Status  ? ? ?Angela [name] is a 88 y.o. female. ? ? ?Altered Mental Status ? ?  ? ?Home Medications ?Prior to Admission medications   ?Not on File  ?   ? ?Allergies    ?Patient has no known allergies.   ? ?Review of Systems   ?Review of Systems ? ?Physical Exam ?Updated Vital Signs ?BP 134/68   Pulse 60   Temp 98.3 ?F (36.8 ?C) (Oral)   Resp 17   Ht 5\' 4"  (1.626 m)   Wt 83 kg   SpO2 96%   BMI 31.41 kg/m?  ?Physical Exam ? ?ED Results / Procedures / Treatments   ?Labs ?(all labs ordered are listed, but only abnormal results are displayed) ?Labs Reviewed  ?COMPREHENSIVE METABOLIC PANEL - Abnormal; Notable for the following components:  ?    Result Value  ? Calcium 8.6 (*)   ? GFR, Estimated 56 (*)   ? All other components within normal limits  ?CBC - Abnormal; Notable for the following components:  ? WBC 3.5 (*)   ? Hemoglobin 11.9 (*)   ? All other components within normal limits  ?URINE CULTURE  ?URINALYSIS, ROUTINE W REFLEX MICROSCOPIC  ?AMMONIA  ? ? ?EKG ?None ? ?Radiology ?CT Head Wo Contrast ? ?Result Date: 10/16/2021 ?CLINICAL DATA:  Altered mental status over the past few days EXAM: CT HEAD WITHOUT CONTRAST TECHNIQUE: Contiguous axial images were obtained from the base of the skull through the vertex without intravenous contrast. RADIATION DOSE REDUCTION: This exam was performed according to the departmental dose-optimization program which includes automated exposure control, adjustment of the mA and/or kV according to patient size and/or use of iterative reconstruction technique. COMPARISON:  None Available. FINDINGS: Brain: No evidence of acute infarction, hemorrhage, hydrocephalus, extra-axial collection or mass lesion/mass effect. Chronic ischemic  changes are noted. Vascular: No hyperdense vessel or unexpected calcification. Skull: Normal. Negative for fracture or focal lesion. Sinuses/Orbits: No acute finding. Other: None. IMPRESSION: Chronic white matter ischemic changes.  No acute abnormality noted. Electronically Signed   By: 12/16/2021 M.D.   On: 10/16/2021 22:28   ? ?Procedures ?Procedures  ?{Document cardiac monitor, telemetry assessment procedure when appropriate:1} ? ?Medications Ordered in ED ?Medications  ?lactated ringers bolus 1,000 mL (has no administration in time range)  ? ? ?ED Course/ Medical Decision Making/ A&P ?  ?                        ?Medical Decision Making ?Amount and/or Complexity of Data Reviewed ?Labs: ordered. ?Radiology: ordered. ?ECG/medicine tests: ordered. ? ? ?*** ? ?{Document critical care time when appropriate:1} ?{Document review of labs and clinical decision tools ie heart score, Chads2Vasc2 etc:1}  ?{Document your independent review of radiology images, and any outside records:1} ?{Document your discussion with family members, caretakers, and with consultants:1} ?{Document social determinants of health affecting pt's care:1} ?{Document your decision making why or why not admission, treatments were needed:1} ?Final Clinical Impression(s) / ED Diagnoses ?Final diagnoses:  ?None  ? ? ?Rx / DC Orders ?ED Discharge Orders   ? ? None  ? ?  ? ? ?

## 2021-10-17 DIAGNOSIS — R410 Disorientation, unspecified: Secondary | ICD-10-CM | POA: Diagnosis not present

## 2021-10-17 LAB — AMMONIA: Ammonia: 25 umol/L (ref 9–35)

## 2021-10-17 NOTE — Discharge Instructions (Signed)
Your workup in the ED today was reassuring. I wonder if you are having a normal stress/grief reaction and this is the way you are coping with it. I would let it go for a few more days and see if things start to improve. If they are not improving, I would speak with your primary doctor about starting something like seroquel. If it gets worse, she starts getting commands, angry or violent return to the emergency department for more psychiatric workup.  ?

## 2021-10-17 NOTE — ED Notes (Signed)
Returns from restroom. Per family. They would like dc. Messaged EDP ?

## 2021-10-17 NOTE — ED Notes (Signed)
Went over d/c papers with family. Wheeled to the car by family.  ?

## 2021-10-19 LAB — URINE CULTURE: Culture: >=100000 — AB

## 2021-10-20 ENCOUNTER — Telehealth: Payer: Self-pay

## 2021-10-20 NOTE — Telephone Encounter (Signed)
Post ED Visit - Positive Culture Follow-up ? ?Culture report reviewed by antimicrobial stewardship pharmacist: ?[facility] ?[x]  , Pharm.D. ?[]  Sula Soda, .D., BCPS AQ-ID ?[]  Celedonio Miyamoto, Pharm.D., BCPS ?[]  [address], .D., BCPS ?[]  [place], .D., BCPS, AAHIVP ?[]  Georgina Pillion, Pharm.D., BCPS, AAHIVP ?[]  [address], PharmD, BCPS ?[]  , PharmD, BCPS ?[]  [place], PharmD, BCPS ?[]  [address], PharmD ?[]  , PharmD, BCPS ?[]  Estella Husk, PharmD ? ? [facility] ?[]  Lysle Pearl, PharmD ?[]  , PharmD ?[]  Phillips Climes, PharmD ?[]  , Rph ?[]  Agapito Games) , PharmD ?[]  Verlan Friends, PharmD ?[]  , PharmD ?[]  Mervyn Gay, PharmD ?[]  , PharmD ?[]  Vinnie Level, PharmD ?[]  Gerri Spore, PharmD ?[]  , PharmD ?[]  Len Childs, PharmD ? ? ?Positive urine culture ?Not treated and ok without abx and no further patient follow-up is required at this time. ? ? ?10/20/2021, 9:27 AM ?  ?

## 2021-10-21 DIAGNOSIS — G309 Alzheimer's disease, unspecified: Secondary | ICD-10-CM | POA: Diagnosis not present

## 2021-10-21 DIAGNOSIS — R5383 Other fatigue: Secondary | ICD-10-CM | POA: Diagnosis not present

## 2021-10-21 DIAGNOSIS — I10 Essential (primary) hypertension: Secondary | ICD-10-CM | POA: Diagnosis not present

## 2021-10-21 DIAGNOSIS — Z299 Encounter for prophylactic measures, unspecified: Secondary | ICD-10-CM | POA: Diagnosis not present

## 2021-10-30 DIAGNOSIS — I10 Essential (primary) hypertension: Secondary | ICD-10-CM | POA: Diagnosis not present

## 2021-10-30 DIAGNOSIS — G309 Alzheimer's disease, unspecified: Secondary | ICD-10-CM | POA: Diagnosis not present

## 2021-10-30 DIAGNOSIS — Z789 Other specified health status: Secondary | ICD-10-CM | POA: Diagnosis not present

## 2021-10-30 DIAGNOSIS — F419 Anxiety disorder, unspecified: Secondary | ICD-10-CM | POA: Diagnosis not present

## 2021-10-30 DIAGNOSIS — Z299 Encounter for prophylactic measures, unspecified: Secondary | ICD-10-CM | POA: Diagnosis not present

## 2021-11-30 DIAGNOSIS — Z299 Encounter for prophylactic measures, unspecified: Secondary | ICD-10-CM | POA: Diagnosis not present

## 2021-11-30 DIAGNOSIS — E039 Hypothyroidism, unspecified: Secondary | ICD-10-CM | POA: Diagnosis not present

## 2021-11-30 DIAGNOSIS — Z6831 Body mass index (BMI) 31.0-31.9, adult: Secondary | ICD-10-CM | POA: Diagnosis not present

## 2021-11-30 DIAGNOSIS — G309 Alzheimer's disease, unspecified: Secondary | ICD-10-CM | POA: Diagnosis not present

## 2021-11-30 DIAGNOSIS — I10 Essential (primary) hypertension: Secondary | ICD-10-CM | POA: Diagnosis not present

## 2022-01-04 DIAGNOSIS — G309 Alzheimer's disease, unspecified: Secondary | ICD-10-CM | POA: Diagnosis not present

## 2022-01-04 DIAGNOSIS — Z299 Encounter for prophylactic measures, unspecified: Secondary | ICD-10-CM | POA: Diagnosis not present

## 2022-01-04 DIAGNOSIS — F331 Major depressive disorder, recurrent, moderate: Secondary | ICD-10-CM | POA: Diagnosis not present

## 2022-01-04 DIAGNOSIS — E039 Hypothyroidism, unspecified: Secondary | ICD-10-CM | POA: Diagnosis not present

## 2022-01-04 DIAGNOSIS — I10 Essential (primary) hypertension: Secondary | ICD-10-CM | POA: Diagnosis not present

## 2022-02-02 DIAGNOSIS — Z1331 Encounter for screening for depression: Secondary | ICD-10-CM | POA: Diagnosis not present

## 2022-02-02 DIAGNOSIS — Z0000 Encounter for general adult medical examination without abnormal findings: Secondary | ICD-10-CM | POA: Diagnosis not present

## 2022-02-02 DIAGNOSIS — R12 Heartburn: Secondary | ICD-10-CM | POA: Diagnosis not present

## 2022-02-02 DIAGNOSIS — Z299 Encounter for prophylactic measures, unspecified: Secondary | ICD-10-CM | POA: Diagnosis not present

## 2022-02-02 DIAGNOSIS — Z79899 Other long term (current) drug therapy: Secondary | ICD-10-CM | POA: Diagnosis not present

## 2022-02-02 DIAGNOSIS — E039 Hypothyroidism, unspecified: Secondary | ICD-10-CM | POA: Diagnosis not present

## 2022-02-02 DIAGNOSIS — Z7189 Other specified counseling: Secondary | ICD-10-CM | POA: Diagnosis not present

## 2022-02-02 DIAGNOSIS — Z6831 Body mass index (BMI) 31.0-31.9, adult: Secondary | ICD-10-CM | POA: Diagnosis not present

## 2022-02-02 DIAGNOSIS — E7800 Pure hypercholesterolemia, unspecified: Secondary | ICD-10-CM | POA: Diagnosis not present

## 2022-02-02 DIAGNOSIS — R5383 Other fatigue: Secondary | ICD-10-CM | POA: Diagnosis not present

## 2022-05-07 DEATH — deceased

## 2023-09-11 IMAGING — DX DG CHEST 2V
3 series · 3 of 3 positions shown · non-contrast
Comparison: None Available.

CLINICAL DATA: Confusion

EXAM:
CHEST - 2 VIEW

[chest ap (1 of 2)]
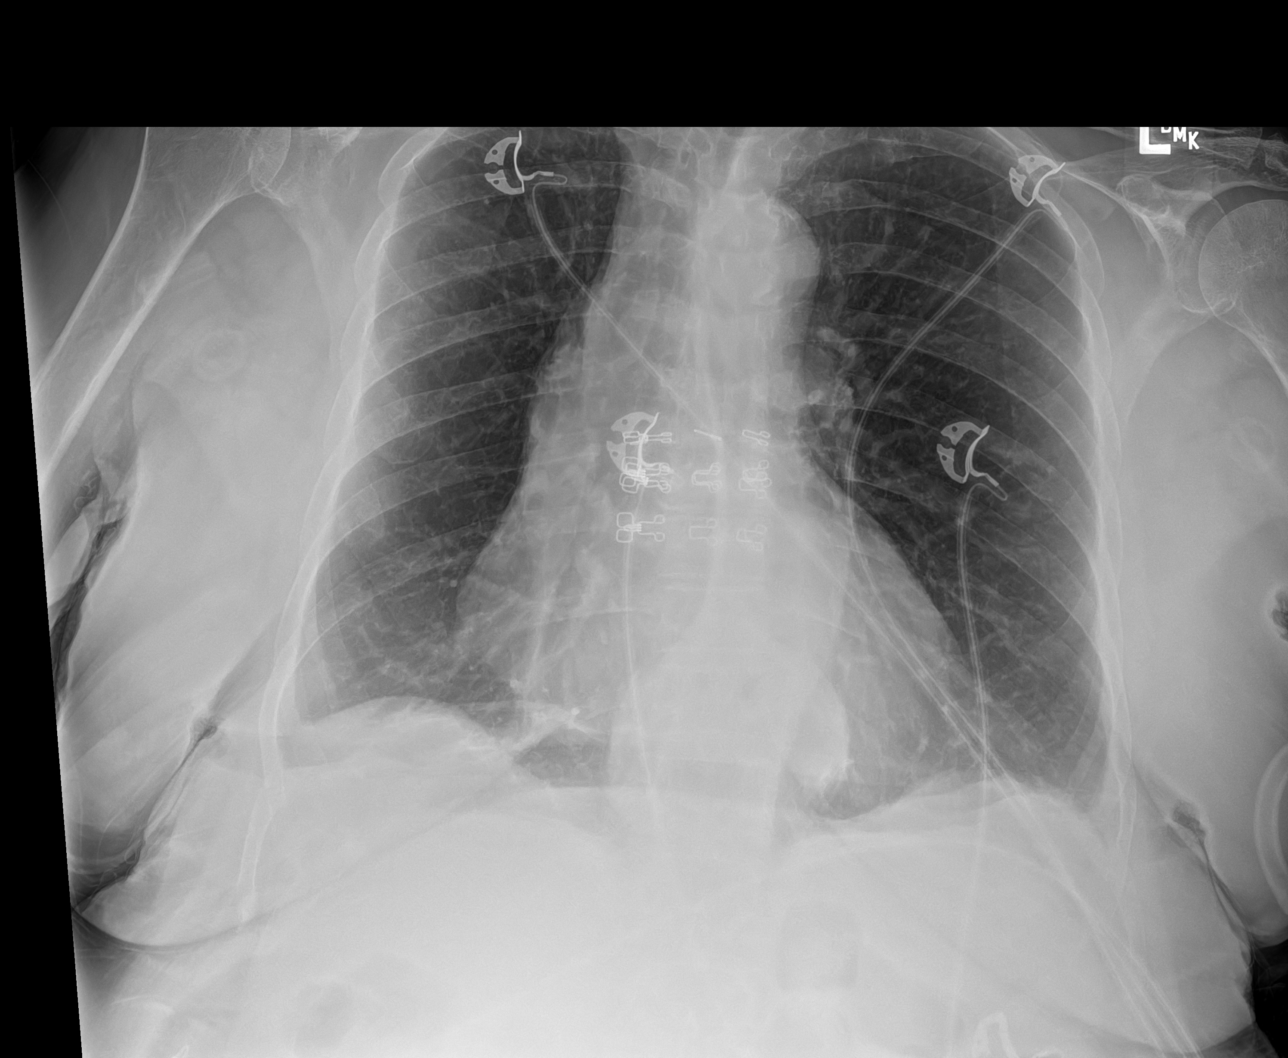

[chest lat]
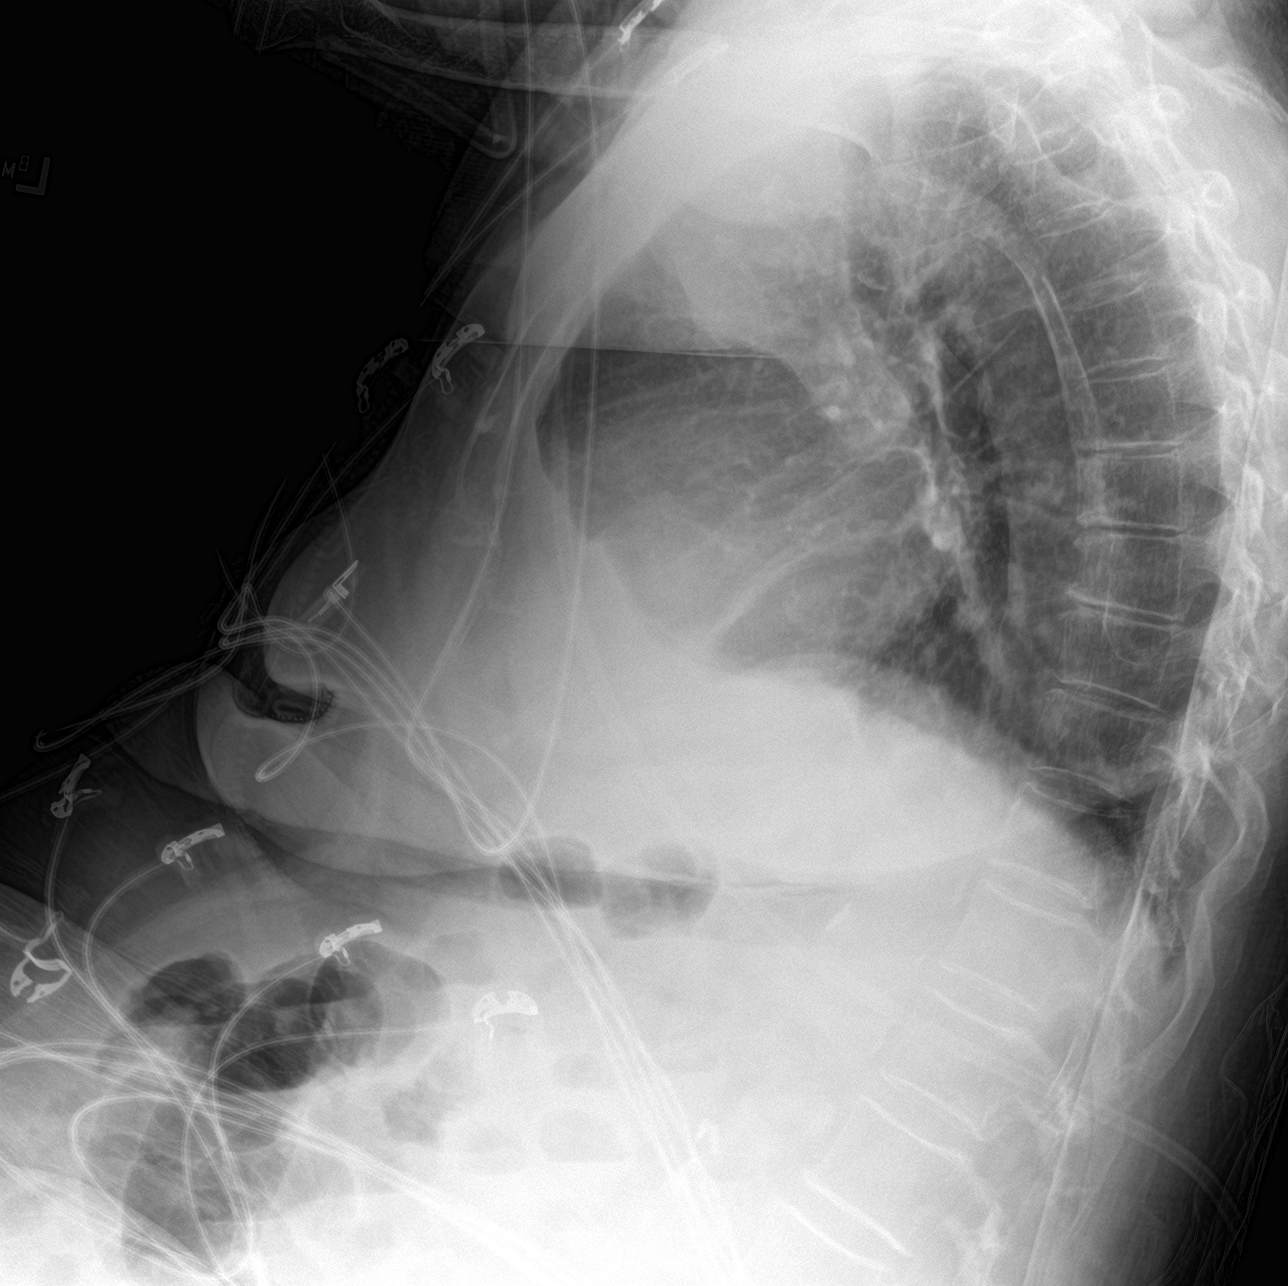

[chest ap (2 of 2)]
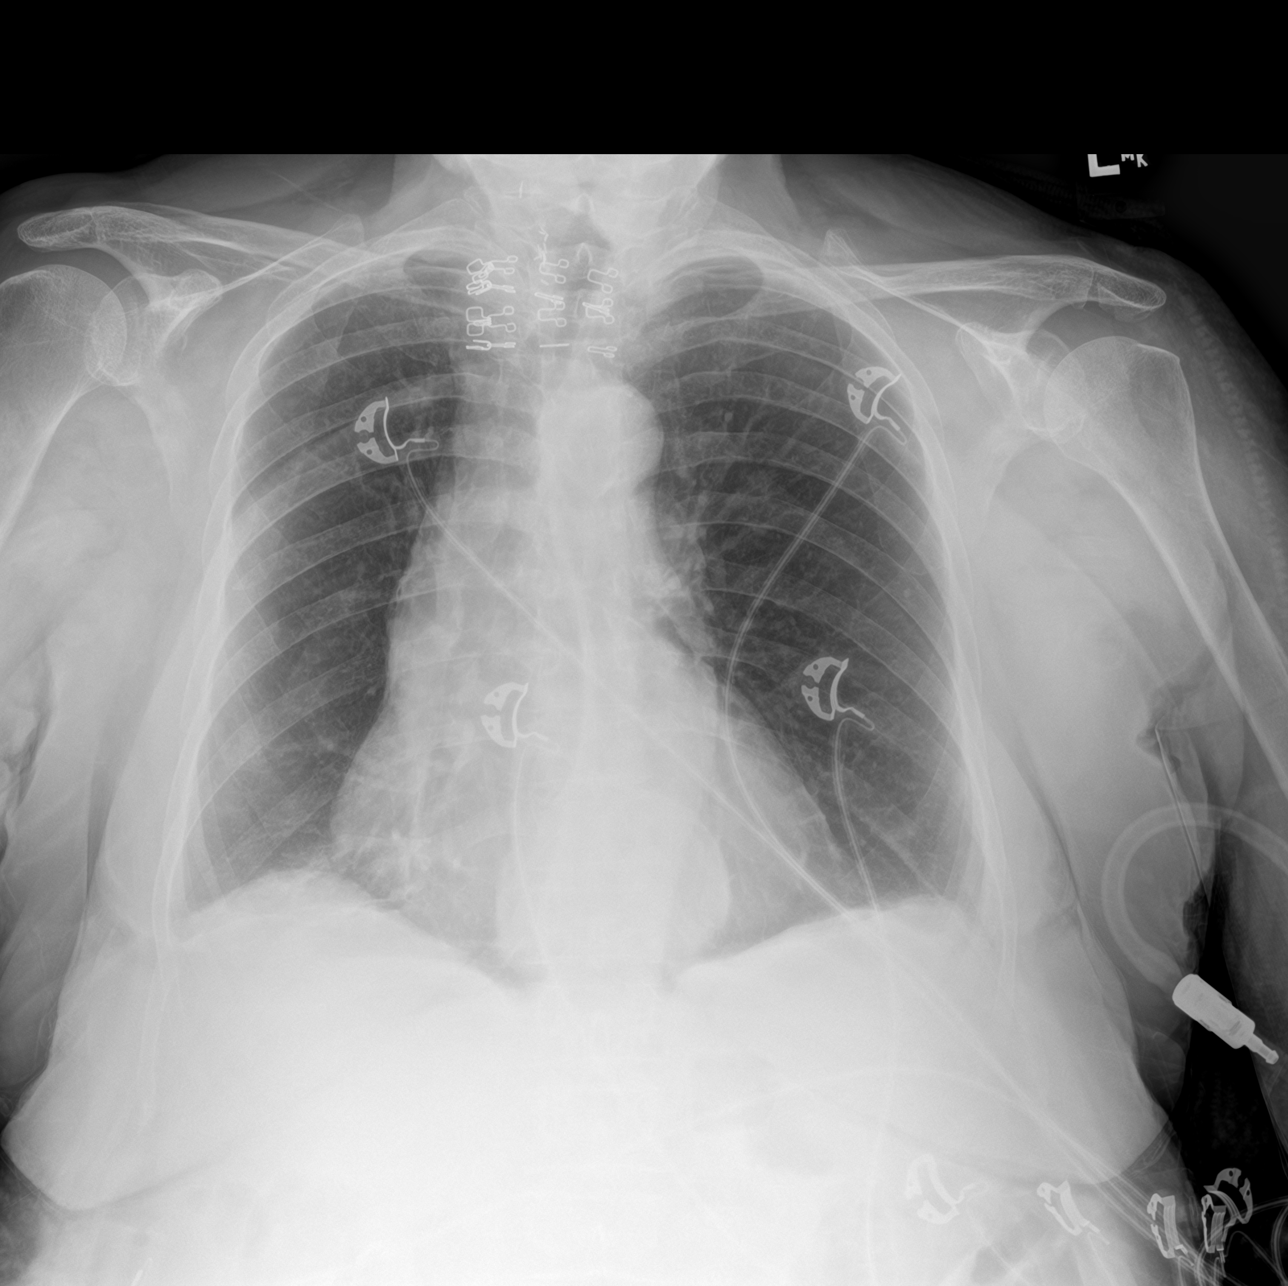

[3 of 3 positions shown; findings below may reference images not displayed]

FINDINGS: Mild cardiomegaly. Moderate-sized hiatal hernia. Prominently linear
density at the right lung base, likely scarring or atelectasis. Left
lung clear. No effusions. No acute bony abnormality.
IMPRESSION: Cardiomegaly.

Right base atelectasis or scarring.

Moderate-sized hiatal hernia.
